# Patient Record
Sex: Female | Born: 2000 | Race: Black or African American | Hispanic: No | Marital: Single | Smoking: Never smoker
Health system: Southern US, Community
[De-identification: ages and names within clinical notes are randomized; demographics above are authoritative.]

## PROBLEM LIST (undated history)

## (undated) DIAGNOSIS — J45909 Unspecified asthma, uncomplicated: Secondary | ICD-10-CM

## (undated) DIAGNOSIS — F419 Anxiety disorder, unspecified: Secondary | ICD-10-CM

## (undated) DIAGNOSIS — F32A Depression, unspecified: Secondary | ICD-10-CM

## (undated) HISTORY — DX: Anxiety disorder, unspecified: F41.9

## (undated) HISTORY — DX: Depression, unspecified: F32.A

---
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
## 2022-02-25 ENCOUNTER — Other Ambulatory Visit: Payer: Self-pay

## 2022-02-25 ENCOUNTER — Emergency Department
Admission: EM | Admit: 2022-02-25 | Discharge: 2022-02-25 | Disposition: A | Discharge status: Home / Self Care | Payer: Medicaid Other | Attending: Student in an Organized Health Care Education/Training Program | Admitting: Student in an Organized Health Care Education/Training Program

## 2022-02-25 DIAGNOSIS — H1033 Unspecified acute conjunctivitis, bilateral: Secondary | ICD-10-CM | POA: Insufficient documentation

## 2022-02-25 MED ORDER — POLYMYXIN B-TRIMETHOPRIM 10000-0.1 UNIT/ML-% OP SOLN: 1.0000 [drp] | OPHTHALMIC | 0 refills | Status: AC

## 2022-02-25 NOTE — ED Provider Notes (Signed)
[facility] Provider Note    Event Date/Time   First MD Initiated Contact with Patient 02/25/22 1304     (approximate)   History   Conjunctivitis (X1 week)   HPI  Brianna Vang is a 21 y.o. female presents to the ER for evaluation of several days of bilateral eye itching as well as some purulence and matting every morning.  Has been trying Visine drops without much improvement.  Denies any blurry vision no photophobia.  No fevers or chills.     Physical Exam   Triage Vital Signs: ED Triage Vitals  Enc Vitals Group     BP 02/25/22 1138 98/75     Pulse Rate 02/25/22 1138 90     Resp 02/25/22 1138 16     Temp 02/25/22 1138 98.5 F (36.9 C)     Temp Source 02/25/22 1138 Oral     SpO2 02/25/22 1138 100 %     Weight 02/25/22 1135 160 lb (72.6 kg)     Height 02/25/22 1135 4\' 11"  (1.499 m)     Head Circumference --      Peak Flow --      Pain Score 02/25/22 1135 0     Pain Loc --      Pain Edu? --      Excl. in [place]? --     Most recent vital signs: Vitals:   02/25/22 1138  BP: 98/75  Pulse: 90  Resp: 16  Temp: 98.5 F (36.9 C)  SpO2: 100%     Constitutional: Alert  Eyes: Conjunctivae are red left greater than right..  Limbic sparing.  Pupils are equal round and reactive.  No proptosis.  Extraocular motions are intact. Head: Atraumatic. Nose: No congestion/rhinnorhea. Mouth/Throat: Mucous membranes are moist.   Neck: Painless ROM.  Cardiovascular:   Good peripheral circulation. Respiratory: Normal respiratory effort.  No retractions.  Gastrointestinal: Soft and nontender.  Musculoskeletal:  no deformity Neurologic:  MAE spontaneously. No gross focal neurologic deficits are appreciated.  Skin:  Skin is warm, dry and intact. No rash noted. Psychiatric: Mood and affect are normal. Speech and behavior are normal.    ED Results / Procedures / Treatments   Labs (all labs ordered are listed, but only abnormal results are  displayed) Labs Reviewed - No data to display   EKG     RADIOLOGY    PROCEDURES:  Critical Care performed:   Procedures   MEDICATIONS ORDERED IN ED: Medications - No data to display   IMPRESSION / MDM / [place] / ED COURSE  I reviewed the triage vital signs and the nursing notes.                              Differential diagnosis includes, but is not limited to, conjunctivitis, blepharitis, allergies, foreign body  Patient presents to the ER for evaluation symptoms as described above.  Exam and history consistent with conjunctivitis.  No injury to suggest ulcer.   Doesn't wear contacts. No blurry vision.  Will be treated with antibiotic ointment treat for conjunctivitis.  Will be given referral to ophthalmology as needed.  Discussed return precautions.  Patient agreeable to plan.   FINAL CLINICAL IMPRESSION(S) / ED DIAGNOSES   Final diagnoses:  Acute conjunctivitis of both eyes, unspecified acute conjunctivitis type     Rx / DC Orders   ED Discharge Orders  Ordered    trimethoprim-polymyxin b (POLYTRIM) ophthalmic solution  Every 4 hours        02/25/22 1308             Note:  This document was prepared using Dragon voice recognition software and may include unintentional dictation errors.    Willy Eddy, MD 02/25/22 1311

## 2022-02-25 NOTE — ED Triage Notes (Signed)
Pt reports possible left pink eye and seasonal allergies that started 1 week ago. Pt reports taking eye drops and tylenol. Pt reports getting seasonal allergies every year.

## 2023-04-07 ENCOUNTER — Emergency Department
Admission: EM | Admit: 2023-04-07 | Discharge: 2023-04-07 | Disposition: A | Discharge status: Home / Self Care | Payer: Self-pay | Attending: Emergency Medicine | Admitting: Emergency Medicine

## 2023-04-07 ENCOUNTER — Other Ambulatory Visit: Payer: Self-pay

## 2023-04-07 DIAGNOSIS — F12920 Cannabis use, unspecified with intoxication, uncomplicated: Secondary | ICD-10-CM | POA: Insufficient documentation

## 2023-04-07 MED ORDER — ONDANSETRON 4 MG PO TBDP
4.0000 mg | ORAL_TABLET | Freq: Once | ORAL | Status: AC
  Administered 2023-04-07: 4 mg via ORAL
  Filled 2023-04-07: qty 1

## 2023-04-07 NOTE — ED Triage Notes (Signed)
Pt ambulatory to triage. Reports ate a 20 mg THC edible at 2100. Pt states "I feel hazy." Denies pain. Alert and following commands. Breathing unlabored.

## 2023-04-07 NOTE — ED Notes (Signed)
Patient and family provided with discharge instructions including importance of not continuing drugs at home with stated understanding. Patient stable and ambulatory with steady even gait with safe ride from family.

## 2023-04-08 NOTE — ED Provider Notes (Signed)
   [facility] Provider Note    Event Date/Time   First MD Initiated Contact with Patient 04/07/23 2301     (approximate)   History   Ingestion   HPI  Brianna Vang is a 22 y.o. female who presents to the ED for evaluation of Ingestion   Patient presents acutely intoxicated with cannabis product.  She reports buying edibles online off of an Instagram page that were mailed to her.  She reports taking a single 20 mg THC edible this evening around 9 PM.  She reports feeling weird and wanted to make sure she was not going to overdose and die.  No suicidal thoughts.  No other ingestions.   Physical Exam   Triage Vital Signs: ED Triage Vitals  Encounter Vitals Group     BP 04/07/23 2252 114/72     Systolic BP Percentile --      Diastolic BP Percentile --      Pulse Rate 04/07/23 2252 (!) 101     Resp 04/07/23 2252 17     Temp 04/07/23 2252 97.9 F (36.6 C)     Temp Source 04/07/23 2252 Oral     SpO2 04/07/23 2252 100 %     Weight 04/07/23 2251 120 lb (54.4 kg)     Height 04/07/23 2251 4\' 11"  (1.499 m)     Head Circumference --      Peak Flow --      Pain Score 04/07/23 2251 0     Pain Loc --      Pain Education --      Exclude from Growth Chart --     Most recent vital signs: Vitals:   04/07/23 2252 04/07/23 2330  BP: 114/72 108/64  Pulse: (!) 101 88  Resp: 17 16  Temp: 97.9 F (36.6 C) 98 F (36.7 C)  SpO2: 100% 100%    General: Awake, no distress.  CV:  Good peripheral perfusion.  Resp:  Normal effort.  Abd:  No distention.  MSK:  No deformity noted.  Neuro:  No focal deficits appreciated. Other:     ED Results / Procedures / Treatments   Labs (all labs ordered are listed, but only abnormal results are displayed) Labs Reviewed - No data to display  EKG   RADIOLOGY   Official radiology report(s): No results found.  PROCEDURES and INTERVENTIONS:  Procedures  Medications  ondansetron (ZOFRAN-ODT) disintegrating  tablet 4 mg (4 mg Oral Given 04/07/23 2332)     IMPRESSION / MDM / ASSESSMENT AND PLAN / ED COURSE  I reviewed the triage vital signs and the nursing notes.  Differential diagnosis includes, but is not limited to, polysubstance abuse, intentional overdose, suicidal  Patient presents acutely intoxicated with cannabis edibles suitable for outpatient management.  No severe or psychotic features.  Provided Zofran, water and she has a safe ride home with sister and brother-in-law.     FINAL CLINICAL IMPRESSION(S) / ED DIAGNOSES   Final diagnoses:  Cannabis intoxication without complication (HCC)     Rx / DC Orders   ED Discharge Orders     None        Note:  This document was prepared using Dragon voice recognition software and may include unintentional dictation errors.   Delton [place], MD 04/08/23 [phone]

## 2024-01-07 ENCOUNTER — Ambulatory Visit (HOSPITAL_COMMUNITY)
Admission: EM | Admit: 2024-01-07 | Discharge: 2024-01-08 | Disposition: A | Discharge status: Other Acute Inpatient Hospital | Payer: Self-pay | Attending: Psychiatry | Admitting: Psychiatry

## 2024-01-07 DIAGNOSIS — Z79899 Other long term (current) drug therapy: Secondary | ICD-10-CM | POA: Insufficient documentation

## 2024-01-07 DIAGNOSIS — Z9151 Personal history of suicidal behavior: Secondary | ICD-10-CM | POA: Insufficient documentation

## 2024-01-07 DIAGNOSIS — F322 Major depressive disorder, single episode, severe without psychotic features: Secondary | ICD-10-CM | POA: Diagnosis present

## 2024-01-07 DIAGNOSIS — R45851 Suicidal ideations: Secondary | ICD-10-CM | POA: Insufficient documentation

## 2024-01-07 DIAGNOSIS — F411 Generalized anxiety disorder: Secondary | ICD-10-CM | POA: Insufficient documentation

## 2024-01-07 DIAGNOSIS — Z9152 Personal history of nonsuicidal self-harm: Secondary | ICD-10-CM | POA: Insufficient documentation

## 2024-01-07 LAB — CBC WITH DIFFERENTIAL/PLATELET
Abs Immature Granulocytes: 0.02 K/uL (ref 0.00–0.07)
Basophils Absolute: 0 K/uL (ref 0.0–0.1)
Basophils Relative: 0 %
Eosinophils Absolute: 0.4 K/uL (ref 0.0–0.5)
Eosinophils Relative: 6 %
HCT: 38.7 % (ref 36.0–46.0)
Hemoglobin: 11.6 g/dL — ABNORMAL LOW (ref 12.0–15.0)
Immature Granulocytes: 0 %
Lymphocytes Relative: 27 %
Lymphs Abs: 1.8 K/uL (ref 0.7–4.0)
MCH: 21.8 pg — ABNORMAL LOW (ref 26.0–34.0)
MCHC: 30 g/dL (ref 30.0–36.0)
MCV: 72.9 fL — ABNORMAL LOW (ref 80.0–100.0)
Monocytes Absolute: 0.6 K/uL (ref 0.1–1.0)
Monocytes Relative: 9 %
Neutro Abs: 3.8 K/uL (ref 1.7–7.7)
Neutrophils Relative %: 58 %
Platelets: 330 K/uL (ref 150–400)
RBC: 5.31 MIL/uL — ABNORMAL HIGH (ref 3.87–5.11)
RDW: 16 % — ABNORMAL HIGH (ref 11.5–15.5)
WBC: 6.7 K/uL (ref 4.0–10.5)
nRBC: 0 % (ref 0.0–0.2)

## 2024-01-07 LAB — URINALYSIS, ROUTINE W REFLEX MICROSCOPIC
Bacteria, UA: NONE SEEN
Bilirubin Urine: NEGATIVE
Glucose, UA: NEGATIVE mg/dL
Hgb urine dipstick: NEGATIVE
Ketones, ur: 20 mg/dL — AB
Leukocytes,Ua: NEGATIVE
Nitrite: NEGATIVE
Protein, ur: 30 mg/dL — AB
Specific Gravity, Urine: 1.027 (ref 1.005–1.030)
pH: 5 (ref 5.0–8.0)

## 2024-01-07 LAB — TSH: TSH: 0.351 u[IU]/mL (ref 0.350–4.500)

## 2024-01-07 LAB — COMPREHENSIVE METABOLIC PANEL WITH GFR
ALT: 10 U/L (ref 0–44)
AST: 17 U/L (ref 15–41)
Albumin: 4.1 g/dL (ref 3.5–5.0)
Alkaline Phosphatase: 56 U/L (ref 38–126)
Anion gap: 13 (ref 5–15)
BUN: 9 mg/dL (ref 6–20)
CO2: 23 mmol/L (ref 22–32)
Calcium: 9.5 mg/dL (ref 8.9–10.3)
Chloride: 102 mmol/L (ref 98–111)
Creatinine, Ser: 0.64 mg/dL (ref 0.44–1.00)
GFR, Estimated: >60 mL/min (ref >60)
Glucose, Bld: 77 mg/dL (ref 70–99)
Potassium: 4 mmol/L (ref 3.5–5.1)
Sodium: 138 mmol/L (ref 135–145)
Total Bilirubin: 2.9 mg/dL — ABNORMAL HIGH (ref 0.0–1.2)
Total Protein: 7.1 g/dL (ref 6.5–8.1)

## 2024-01-07 LAB — MAGNESIUM: Magnesium: 2.1 mg/dL (ref 1.7–2.4)

## 2024-01-07 LAB — POCT URINE DRUG SCREEN - MANUAL ENTRY (I-SCREEN)
POC Amphetamine UR: NOT DETECTED
POC Buprenorphine (BUP): NOT DETECTED
POC Cocaine UR: NOT DETECTED
POC Marijuana UR: NOT DETECTED
POC Methadone UR: NOT DETECTED
POC Methamphetamine UR: NOT DETECTED
POC Morphine: NOT DETECTED
POC Oxazepam (BZO): NOT DETECTED
POC Oxycodone UR: NOT DETECTED
POC Secobarbital (BAR): NOT DETECTED

## 2024-01-07 LAB — LIPID PANEL
Cholesterol: 168 mg/dL (ref 0–200)
HDL: 58 mg/dL (ref >40)
LDL Cholesterol: 102 mg/dL — ABNORMAL HIGH (ref 0–99)
Total CHOL/HDL Ratio: 2.9 ratio
Triglycerides: 41 mg/dL (ref <150)
VLDL: 8 mg/dL (ref 0–40)

## 2024-01-07 LAB — VITAMIN B12: Vitamin B-12: 735 pg/mL (ref 180–914)

## 2024-01-07 LAB — HEMOGLOBIN A1C
Hgb A1c MFr Bld: 5 % (ref 4.8–5.6)
Mean Plasma Glucose: 96.8 mg/dL

## 2024-01-07 LAB — POC URINE PREG, ED: Preg Test, Ur: NEGATIVE

## 2024-01-07 LAB — ETHANOL: Alcohol, Ethyl (B): <15 mg/dL (ref <15)

## 2024-01-07 LAB — VITAMIN D 25 HYDROXY (VIT D DEFICIENCY, FRACTURES): Vit D, 25-Hydroxy: 5.6 ng/mL — ABNORMAL LOW (ref 30–100)

## 2024-01-07 MED ORDER — HALOPERIDOL LACTATE 5 MG/ML IJ SOLN: 10.0000 mg | Freq: Three times a day (TID) | INTRAMUSCULAR | Status: DC | PRN

## 2024-01-07 MED ORDER — ACETAMINOPHEN 325 MG PO TABS: 650.0000 mg | ORAL_TABLET | Freq: Four times a day (QID) | ORAL | Status: DC | PRN

## 2024-01-07 MED ORDER — DIPHENHYDRAMINE HCL 50 MG/ML IJ SOLN: 50.0000 mg | Freq: Three times a day (TID) | INTRAMUSCULAR | Status: DC | PRN

## 2024-01-07 MED ORDER — DIPHENHYDRAMINE HCL 50 MG PO CAPS: 50.0000 mg | ORAL_CAPSULE | Freq: Three times a day (TID) | ORAL | Status: DC | PRN

## 2024-01-07 MED ORDER — MAGNESIUM HYDROXIDE 400 MG/5ML PO SUSP: 30.0000 mL | Freq: Every day | ORAL | Status: DC | PRN

## 2024-01-07 MED ORDER — HALOPERIDOL LACTATE 5 MG/ML IJ SOLN: 5.0000 mg | Freq: Three times a day (TID) | INTRAMUSCULAR | Status: DC | PRN

## 2024-01-07 MED ORDER — LORAZEPAM 2 MG/ML IJ SOLN: 2.0000 mg | Freq: Three times a day (TID) | INTRAMUSCULAR | Status: DC | PRN

## 2024-01-07 MED ORDER — HALOPERIDOL 5 MG PO TABS: 5.0000 mg | ORAL_TABLET | Freq: Three times a day (TID) | ORAL | Status: DC | PRN

## 2024-01-07 MED ORDER — HYDROXYZINE HCL 25 MG PO TABS: 25.0000 mg | ORAL_TABLET | Freq: Three times a day (TID) | ORAL | Status: DC | PRN

## 2024-01-07 MED ORDER — ALUM & MAG HYDROXIDE-SIMETH 200-200-20 MG/5ML PO SUSP: 30.0000 mL | ORAL | Status: DC | PRN

## 2024-01-07 MED ORDER — TRAZODONE HCL 50 MG PO TABS: 50.0000 mg | ORAL_TABLET | Freq: Every evening | ORAL | Status: DC | PRN

## 2024-01-07 NOTE — ED Notes (Signed)
 Patient currently sleeping and resting in recliner. RR even and unlabored, appearing in no noted distress. Environmental check complete

## 2024-01-07 NOTE — ED Notes (Addendum)
 Patient calm and cooperative during initial nurse shift assessment. Pt observed/assessed resting in recliner. Patient denies any SI/HI/AVH. Superficial cut on right wrist dry, clean, and no drainage. RR even and unlabored, appearing in no noted distress. Environmental check complete.
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
## 2024-01-07 NOTE — ED Notes (Signed)
 Pt oriented to unit, questions denied. Pt reports passive si with no plan or intent, verbal contract for safety provided. Pt provided with dinner. Pt generally cooperative and polite.
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
## 2024-01-07 NOTE — ED Notes (Signed)
 Lab notified nurse that Urine drug of abuse needed to be ordered for the lab to process. Notified Ene, NP of labs order to be able to result. Per Ene, NP cancel lab due to Urine drug screen collected and resulted at [facility]. Notified Lab, Licia Romp, that lab for Urine drug of abuse can be cancelled.

## 2024-01-07 NOTE — ED Notes (Signed)
 Patient currently resting in recliner. RR even and unlabored, appearing in no noted distress. Environmental check complete

## 2024-01-07 NOTE — ED Notes (Signed)
 Brianna Vang has been called for pick. Order number [id number]

## 2024-01-07 NOTE — ED Notes (Addendum)
 Dash recalled for labs.
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
## 2024-01-08 ENCOUNTER — Other Ambulatory Visit: Payer: Self-pay

## 2024-01-08 ENCOUNTER — Inpatient Hospital Stay (HOSPITAL_COMMUNITY)
Admission: AD | Admit: 2024-01-08 | Discharge: 2024-01-11 | DRG: 885 | Disposition: A | Discharge status: Home / Self Care | Payer: Self-pay | Source: Intra-hospital

## 2024-01-08 ENCOUNTER — Encounter (HOSPITAL_COMMUNITY): Payer: Self-pay | Admitting: Nurse Practitioner

## 2024-01-08 DIAGNOSIS — F319 Bipolar disorder, unspecified: Principal | ICD-10-CM | POA: Diagnosis present

## 2024-01-08 DIAGNOSIS — J45909 Unspecified asthma, uncomplicated: Secondary | ICD-10-CM | POA: Diagnosis present

## 2024-01-08 DIAGNOSIS — Z79899 Other long term (current) drug therapy: Secondary | ICD-10-CM | POA: Diagnosis not present

## 2024-01-08 DIAGNOSIS — F410 Panic disorder [episodic paroxysmal anxiety] without agoraphobia: Secondary | ICD-10-CM | POA: Diagnosis present

## 2024-01-08 DIAGNOSIS — Z9151 Personal history of suicidal behavior: Secondary | ICD-10-CM | POA: Diagnosis not present

## 2024-01-08 DIAGNOSIS — R45851 Suicidal ideations: Secondary | ICD-10-CM | POA: Diagnosis present

## 2024-01-08 DIAGNOSIS — E559 Vitamin D deficiency, unspecified: Secondary | ICD-10-CM | POA: Diagnosis present

## 2024-01-08 DIAGNOSIS — F5104 Psychophysiologic insomnia: Secondary | ICD-10-CM | POA: Diagnosis present

## 2024-01-08 DIAGNOSIS — F603 Borderline personality disorder: Secondary | ICD-10-CM | POA: Diagnosis present

## 2024-01-08 DIAGNOSIS — F329 Major depressive disorder, single episode, unspecified: Secondary | ICD-10-CM | POA: Diagnosis present

## 2024-01-08 DIAGNOSIS — Z833 Family history of diabetes mellitus: Secondary | ICD-10-CM | POA: Diagnosis not present

## 2024-01-08 DIAGNOSIS — F411 Generalized anxiety disorder: Secondary | ICD-10-CM | POA: Diagnosis present

## 2024-01-08 DIAGNOSIS — F4320 Adjustment disorder, unspecified: Secondary | ICD-10-CM | POA: Diagnosis present

## 2024-01-08 HISTORY — DX: Unspecified asthma, uncomplicated: J45.909

## 2024-01-08 HISTORY — DX: Bipolar disorder, unspecified: F31.9

## 2024-01-08 HISTORY — DX: Personal history of suicidal behavior: Z91.51

## 2024-01-08 LAB — FERRITIN: Ferritin: 8 ng/mL — ABNORMAL LOW (ref 11–307)

## 2024-01-08 LAB — IRON AND TIBC
Iron: 54 ug/dL (ref 28–170)
Saturation Ratios: 13 % (ref 10.4–31.8)
TIBC: 424 ug/dL (ref 250–450)
UIBC: 371 ug/dL

## 2024-01-08 LAB — RETICULOCYTES
Immature Retic Fract: 11.9 % (ref 2.3–15.9)
RBC.: 5.08 MIL/uL (ref 3.87–5.11)
Retic Count, Absolute: 63 K/uL (ref 19.0–186.0)
Retic Ct Pct: 1.2 % (ref 0.4–3.1)

## 2024-01-08 LAB — FOLATE: Folate: 11 ng/mL (ref >5.9)

## 2024-01-08 LAB — VITAMIN B12: Vitamin B-12: 912 pg/mL (ref 180–914)

## 2024-01-08 MED ORDER — QUETIAPINE FUMARATE 25 MG PO TABS
25.0000 mg | ORAL_TABLET | Freq: Every day | ORAL | Status: DC
  Administered 2024-01-08: 25 mg via ORAL
  Filled 2024-01-08: qty 1

## 2024-01-08 MED ORDER — ALUM & MAG HYDROXIDE-SIMETH 200-200-20 MG/5ML PO SUSP: 30.0000 mL | ORAL | Status: DC | PRN

## 2024-01-08 MED ORDER — MAGNESIUM HYDROXIDE 400 MG/5ML PO SUSP: 30.0000 mL | Freq: Every day | ORAL | Status: DC | PRN

## 2024-01-08 MED ORDER — ACETAMINOPHEN 325 MG PO TABS: 650.0000 mg | ORAL_TABLET | Freq: Four times a day (QID) | ORAL | Status: DC | PRN

## 2024-01-08 MED ORDER — HYDROXYZINE HCL 25 MG PO TABS
25.0000 mg | ORAL_TABLET | Freq: Three times a day (TID) | ORAL | Status: DC | PRN
  Administered 2024-01-08: 25 mg via ORAL
  Filled 2024-01-08 (×2): qty 1

## 2024-01-08 MED ORDER — OLANZAPINE 5 MG PO TBDP: 5.0000 mg | ORAL_TABLET | Freq: Three times a day (TID) | ORAL | Status: DC | PRN

## 2024-01-08 MED ORDER — TRAZODONE HCL 50 MG PO TABS: 50.0000 mg | ORAL_TABLET | Freq: Every evening | ORAL | Status: DC | PRN

## 2024-01-08 MED ORDER — OLANZAPINE 10 MG IM SOLR: 10.0000 mg | Freq: Three times a day (TID) | INTRAMUSCULAR | Status: DC | PRN

## 2024-01-08 MED ORDER — OLANZAPINE 10 MG IM SOLR: 5.0000 mg | Freq: Three times a day (TID) | INTRAMUSCULAR | Status: DC | PRN

## 2024-01-08 MED ORDER — VITAMIN D (ERGOCALCIFEROL) 1.25 MG (50000 UNIT) PO CAPS
50000.0000 [IU] | ORAL_CAPSULE | Freq: Two times a day (BID) | ORAL | Status: DC
  Administered 2024-01-08 – 2024-01-11 (×6): 50000 [IU] via ORAL
  Filled 2024-01-08 (×6): qty 1

## 2024-01-08 MED ORDER — ESCITALOPRAM OXALATE 10 MG PO TABS
10.0000 mg | ORAL_TABLET | Freq: Every day | ORAL | Status: DC
  Administered 2024-01-08 – 2024-01-11 (×4): 10 mg via ORAL
  Filled 2024-01-08: qty 1
  Filled 2024-01-08: qty 7
  Filled 2024-01-08 (×3): qty 1
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
## 2024-01-08 NOTE — [facility] Group Notes (Signed)
 [facility] Group Notes:  (Nursing/MHT/Case Management/Adjunct)  Date:  01/08/2024  Time:  9:21 AM  Type of Therapy:  Group Topic/ Focus: Goals Group: The focus of this group is to help patients establish daily goals to achieve during treatment and discuss how the patient can incorporate goal setting into their daily lives to aide in recovery.   Participation Level:  Did Not Attend  Summary of Progress/Problems:  Patient did not attend goals group today. Patient was encouraged but refused.   Danette JONELLE Boos 01/08/2024, 9:21 AM
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
## 2024-01-08 NOTE — BHH Group Notes (Signed)
 BHH Group Notes:  (Nursing/MHT/Case Management/Adjunct)  Date:  01/08/2024  Time:  2:40 PM  Type of Therapy: Bingo  Participation Level:  Did Not Attend  Summary of Progress/Problems:  Patient did not attend or participate in Bingo. Patient was encouraged but refused.   Danette JONELLE Boos 01/08/2024, 2:40 PM
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
## 2024-01-08 NOTE — Progress Notes (Addendum)
 D. Pt presented anxious, but friendly - reported 'fair' sleep last night, described her appetite as 'fair', energy level as 'low', and concentration as 'good'.Per pt's self inventory, pt rated her depression, hopelessness and anxiety a 6/5/5, respectively. Pt's stated goal today is to take my mind off negative thoughts or things in general from break up and how I think of myself.Pt currently denies SI/HI and AVH and agrees to contact staff before acting on any harmful thoughts. A. Labs and vitals monitored. Pt given and educated on medications. Pt supported emotionally and encouraged to express concerns and ask questions.   R. Pt remains safe with 15 minute checks. Will continue POC.    01/08/24 1300  Psych Admission Type (Psych Patients Only)  Admission Status Voluntary  Psychosocial Assessment  Patient Complaints None  Eye Contact Fair  Facial Expression Animated  Affect Appropriate to circumstance  Speech Logical/coherent  Interaction Minimal  Motor Activity Other (Comment) (steady gait)  Appearance/Hygiene Unremarkable  Behavior Characteristics Cooperative;Appropriate to situation  Mood Depressed;Pleasant  Thought Process  Coherency WDL  Content WDL  Delusions None reported or observed  Perception WDL  Hallucination None reported or observed  Judgment Poor  Confusion None  Danger to Self  Current suicidal ideation? Denies  Agreement Not to Harm Self Yes  Description of Agreement agreed to contact staff before acting on harmful thoughts  Danger to Others  Danger to Others None reported or observed
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
## 2024-01-08 NOTE — Group Note (Signed)
 Date:  01/08/2024 Time:  4:05 PM  Group Topic/Focus:  The focus of this group is to introduce collage as a creative outlet for expressing emotions, reducing anxiety, fostering group cohesion and enabling personal insight.    Participation Level:  Active  Participation Quality:  Appropriate and Attentive  Affect:  Appropriate  Cognitive:  Alert and Appropriate  Insight: Appropriate and Good  Engagement in Group:  Engaged  Modes of Intervention:  Activity, Discussion, Exploration, Rapport Building, Socialization, and Support  Additional Comments:    Berwyn GORMAN Acosta 01/08/2024, 4:05 PM

## 2024-01-08 NOTE — Plan of Care (Signed)
   Problem: Education: Goal: Emotional status will improve Outcome: Progressing Goal: Mental status will improve Outcome: Progressing

## 2024-01-08 NOTE — Group Note (Signed)
 Date:  01/08/2024 Time:  8:55 PM  Group Topic/Focus:  Wrap-Up Group:   The focus of this group is to help patients review their daily goal of treatment and discuss progress on daily workbooks.    Participation Level:  Active  Participation Quality:  Appropriate  Affect:  Appropriate  Cognitive:  Appropriate  Insight: Appropriate  Engagement in Group:  Engaged  Modes of Intervention:  Discussion  Additional Comments:   Pt states that she's proud of herself for being able to socialize today and participate in programming. Today is pts first full day on the unit. Pt is getting acclimated to the unit. Pt spoke with doctor and csw stating they are very informative. Pt rates anxiety and depression at about Brianna 5.   Brianna Vang Brianna Vang 01/08/2024, 8:55 PM

## 2024-01-08 NOTE — Plan of Care (Signed)
   Problem: Education: Goal: Knowledge of [place] [facility] Education information/materials will improve Outcome: Progressing

## 2024-01-08 NOTE — Progress Notes (Signed)
(  Sleep Hours) - 3.5 hours (Any PRNs that were needed, meds refused, or side effects to meds)-  None (Any disturbances and when (visitation, over night)- None (Concerns raised by the patient)-  None (SI/HI/AVH)-  Denies

## 2024-01-09 ENCOUNTER — Encounter (HOSPITAL_COMMUNITY): Payer: Self-pay

## 2024-01-09 NOTE — Group Note (Signed)
 Date:  01/09/2024 Time:  3:59 PM  Group Topic/Focus: Transforming your thinking Managing Feelings:   The focus of this group is to identify what feelings patients have difficulty handling and develop a plan to handle them in a healthier way upon discharge.    Participation Level:  Active  Participation Quality:  Appropriate  Affect:  Appropriate  Cognitive:  Alert  Insight: Appropriate  Engagement in Group:  Engaged  Modes of Intervention:  Discussion  Additional Comments:  Brianna Vang was very engaged during group and asked appropriate questions.   Sharde Gover 01/09/2024, 3:59 PM

## 2024-01-09 NOTE — Progress Notes (Signed)
 [facility] Inpatient Psychiatry Progress Note  Date: 01/09/24 Patient: Brianna Vang MRN: [id number]  Assessment and Plan: Brianna Vang is a 23 y.o. female admitted for suicidal ideation in the context of of a recent breakup. She endorses a history of multiple low-lethality gestures/attempts and other symptoms consistent with bluster B traits. Of note, the reported suicide attempt/gesture was 8/27 (five days ago).      # MDD (major depressive disorder) # Cluster B traits - Escitalopram  10 mg daily - Stop quetiapine   Risk Assessment - Moderate  Discharge Planning Barriers to discharge: Incomplete initial assessment; need to coordinate aftercare plan. Estimated length of stay: 1-2 days Predicted Discharge location: Home    Interval History: Chart reviewed. No significant events overnight. Patient has been med compliant. She reports feeling better today and rates her anxiety as a 5/10. She denied any SI or side effects from escitalopram . She reported sleeping reasonably well last night but would like to stop quetiapine  due to feeling groggy in the morning. She reports a history of multiple prior suicide attempts and gestures and states that the last time she exhibited any suicidal behaviors was Wednesday of last week. She specifically inquired about whether or not she could leave tomorrow and she plans on returning home to stay with her sister.       Physical Exam MSK/Neuro - Normal gait and station Mental Status Exam Appearance - Casually dressed, NAD Attitude - Calm, pleasant Speech - normal volume, prosody, inflection Mood - Fair Affect - Mildly restricted Thought Process - LLGD Thought Content - No delusional TC expressed SI/HI - Denies Perceptions - Denies AVH; not RIS Judgement/Insight - Fair Fund of knowledge - WNL Language - No impairments      Lab Results:  Admission on 01/08/2024  Component Date Value Ref Range Status   Vitamin  B-12 01/08/2024 912  180 - 914 pg/mL Final   Folate 01/08/2024 11.0  >5.9 ng/mL Final   Iron 01/08/2024 54  28 - 170 ug/dL Final   TIBC 91/68/7974 424  250 - 450 ug/dL Final   Saturation Ratios 01/08/2024 13  10.4 - 31.8 % Final   UIBC 01/08/2024 371  ug/dL Final   Ferritin 91/68/7974 8 (L)  11 - 307 ng/mL Final   Retic Ct Pct 01/08/2024 1.2  0.4 - 3.1 % Final   RBC. 01/08/2024 5.08  3.87 - 5.11 MIL/uL Final   Retic Count, Absolute 01/08/2024 63.0  19.0 - 186.0 K/uL Final   Immature Retic Fract 01/08/2024 11.9  2.3 - 15.9 % Final     Vitals: Blood pressure 104/73, pulse 94, temperature 98.1 F (36.7 C), temperature source Oral, resp. rate 20, height 4' 11 (1.499 m), weight 48.3 kg, last menstrual period 01/05/2024, SpO2 100%.    Brianna DELENA Salmon, DO

## 2024-01-09 NOTE — Group Note (Signed)
 Recreation Therapy Group Note   Group Topic:Leisure Education  Group Date: 01/09/2024 Start Time: 0935 End Time: 1015 Facilitators: Joshlyn Beadle-McCall, LRT,CTRS Location: [address]   Group Topic: Leisure Education   Goal Area(s) Addresses:  Patient will successfully identify positive leisure and recreation activities.  Patient will acknowledge benefits of participation in healthy leisure activities post discharge.  Patient will actively work with peers toward a shared goal.   Behavioral Response: Engaged   Intervention: Competitive Group Game   Activity: Guess the [employer]. Patients were to use the spinner to land on a category ([address], Hip Hop, Indie, Dance, R&B and Pop). Whatever category the patient landed on, LRT would read the lyric and patient had to guess the missing word from the lyric. It patient didn't guess the correct answer, everyone else got a chance to steal the point. Whoever guessed the correct answer, got the point. The person with the most cards at the end of the game was the winner.   Education:  Teacher, English as a foreign language, Stress Management, Discharge Planning  Education Outcome: Acknowledges education/In group clarification offered/Needs additional education   Affect/Mood: Appropriate   Participation Level: Engaged   Participation Quality: Independent   Behavior: Appropriate   Speech/Thought Process: Focused   Insight: Good   Judgement: Good   Modes of Intervention: Competitive Play   Patient Response to Interventions:  Engaged   Education Outcome:  In group clarification offered    Clinical Observations/Individualized Feedback: Pt was engaged and focused during activity. Pt was bright and competitive. Pt also had good interaction with peers during activity.       Plan: Continue to engage patient in RT group sessions 2-3x/week.   Deren Degrazia-McCall, LRT,CTRS 01/09/2024 11:53 AM

## 2024-01-09 NOTE — Progress Notes (Addendum)
 D. Pt continues to present anxious, but friendly upon approach, has been visible in the milieu, observed attending groups. Pt reported poor sleep last night, stating that she woke up and was anxious, and didn't like how the sleep medication made her feel. Pt described her appetite as 'fair', concentration as 'good', and energy level as 'normal'. Per pt's self inventory, pt rated her depression, hopelessness and anxiety a 5/3/7, respectively. Pt currently denies SI/HI and AVH and doesn't appear to be responding to internal stimuli. A. Labs and vitals monitored. Pt given and educated on medications. Pt supported emotionally and encouraged to express concerns and ask questions.   R. Pt remains safe with 15 minute checks. Will continue POC.    01/09/24 1100  Psych Admission Type (Psych Patients Only)  Admission Status Voluntary  Psychosocial Assessment  Patient Complaints None  Eye Contact Fair  Facial Expression Anxious  Affect Appropriate to circumstance  Speech Logical/coherent  Interaction Assertive  Motor Activity Other (Comment) (level 3 observation)  Appearance/Hygiene In scrubs  Behavior Characteristics Cooperative;Appropriate to situation  Mood Pleasant;Anxious  Thought Process  Coherency WDL  Content WDL  Delusions None reported or observed  Perception WDL  Hallucination None reported or observed  Judgment Poor  Confusion None  Danger to Self  Current suicidal ideation? Denies  Agreement Not to Harm Self Yes  Description of Agreement agreed to contact staff before acting on harmful thoughts  Danger to Others  Danger to Others None reported or observed

## 2024-01-09 NOTE — BH Assessment (Signed)
(  Sleep Hours) - 8 (Any PRNs that were needed, meds refused, or side effects to meds)- Hydroxyzine  25 mg PO (Any disturbances and when (visitation, over night)- None (Concerns raised by the patient)- Pt is interested in out patient services and request to be D/C in a few days.  (SI/HI/AVH)- None

## 2024-01-09 NOTE — Progress Notes (Signed)
   01/08/24 2030  Psych Admission Type (Psych Patients Only)  Admission Status Voluntary  Psychosocial Assessment  Patient Complaints Sadness  Eye Contact Fair  Facial Expression Animated  Affect Appropriate to circumstance  Speech Logical/coherent  Interaction Assertive  Motor Activity Other (Comment) (WNL)  Appearance/Hygiene In scrubs  Behavior Characteristics Appropriate to situation  Mood Pleasant  Thought Process  Coherency WDL  Content WDL  Delusions None reported or observed  Perception WDL  Hallucination None reported or observed  Judgment Impaired  Confusion None  Danger to Self  Current suicidal ideation?  (Denies)  Agreement Not to Harm Self Yes  Description of Agreement Notify Staff  Danger to Others  Danger to Others None reported or observed

## 2024-01-09 NOTE — [facility]HH Group Notes (Signed)
 Adult Psychoeducational Group Note  Date:  01/09/2024 Time:  10:50 PM  Group Topic/Focus:  Wrap-Up Group:   The focus of this group is to help patients review their daily goal of treatment and discuss progress on daily workbooks.  Participation Level:  Active  Participation Quality:  Appropriate  Affect:  Appropriate  Cognitive:  Alert  Insight: Appropriate  Engagement in Group:  Engaged  Modes of Intervention:  Discussion  Additional Comments:  Patient attended and participated in the Wrap-up group.  Brianna Vang 01/09/2024, 10:50 PM

## 2024-01-09 NOTE — Group Note (Signed)
 Date:  01/09/2024 Time:  8:55 AM  Group Topic/Focus:  Goals Group:   The focus of this group is to help patients establish daily goals to achieve during treatment and discuss how the patient can incorporate goal setting into their daily lives to aide in recovery. Orientation:   The focus of this group is to educate the patient on the purpose and policies of crisis stabilization and provide a format to answer questions about their admission.  The group details unit policies and expectations of patients while admitted.    Participation Level:  Active  Participation Quality:  Appropriate  Affect:  Appropriate  Cognitive:  Appropriate  Insight: Good  Engagement in Group:  Engaged  Modes of Intervention:  Orientation  Additional Comments:    Brianna Vang 01/09/2024, 8:55 AM

## 2024-01-09 NOTE — Plan of Care (Signed)
   Problem: Education: Goal: Knowledge of [place] [facility] Education information/materials will improve Outcome: Progressing Goal: Emotional status will improve Outcome: Progressing Goal: Mental status will improve Outcome: Progressing Goal: Verbalization of understanding the information provided will improve Outcome: Progressing

## 2024-01-09 NOTE — BH IP Treatment Plan (Signed)
 Interdisciplinary Treatment and Diagnostic Plan Update  01/09/2024 Time of Session: 10:30AM CONCETTA GUION MRN: [id number]  Principal Diagnosis: MDD (major depressive disorder)  Secondary Diagnoses: Principal Problem:   MDD (major depressive disorder) Active Problems:   Borderline personality disorder (HCC)   Chronic insomnia   Vitamin D  deficiency   Generalized anxiety disorder with panic attacks   Current Medications:  Current Facility-Administered Medications  Medication Dose Route Frequency Provider Last Rate Last Admin   acetaminophen  (TYLENOL ) tablet 650 mg  650 mg Oral Q6H PRN Ajibola, Ene A, NP       alum & mag hydroxide-simeth (MAALOX/MYLANTA) 200-200-20 MG/5ML suspension 30 mL  30 mL Oral Q4H PRN Ajibola, Ene A, NP       escitalopram  (LEXAPRO ) tablet 10 mg  10 mg Oral Daily Parker, Alvin S, MD   10 mg at 01/09/24 9147   hydrOXYzine  (ATARAX ) tablet 25 mg  25 mg Oral TID PRN Ajibola, Ene A, NP   25 mg at 01/08/24 2101   magnesium  hydroxide (MILK OF MAGNESIA) suspension 30 mL  30 mL Oral Daily PRN Ajibola, Ene A, NP       OLANZapine  (ZYPREXA ) injection 10 mg  10 mg Intramuscular TID PRN Parker, Alvin S, MD       OLANZapine  (ZYPREXA ) injection 5 mg  5 mg Intramuscular TID PRN Parker, Alvin S, MD       OLANZapine  zydis (ZYPREXA ) disintegrating tablet 5 mg  5 mg Oral TID PRN Kennyth Starleen RAMAN, MD       Vitamin D  (Ergocalciferol ) (DRISDOL ) 1.25 MG (50000 UNIT) capsule 50,000 Units  50,000 Units Oral BID Parker, Alvin S, MD   50,000 Units at 01/09/24 9147   PTA Medications: No medications prior to admission.    Patient Stressors: Marital or family conflict   Traumatic event    Patient Strengths: Average or above average intelligence  Capable of independent living  Communication skills  Physical Health  Supportive family/friends  Work skills   Treatment Modalities: Medication Management, Group therapy, Case management,  1 to 1 session with clinician, Psychoeducation,  Recreational therapy.   Physician Treatment Plan for Primary Diagnosis: MDD (major depressive disorder) Long Term Goal(s):     Short Term Goals:    Medication Management: Evaluate patient's response, side effects, and tolerance of medication regimen.  Therapeutic Interventions: 1 to 1 sessions, Unit Group sessions and Medication administration.  Evaluation of Outcomes: Not Progressing  Physician Treatment Plan for Secondary Diagnosis: Principal Problem:   MDD (major depressive disorder) Active Problems:   Borderline personality disorder (HCC)   Chronic insomnia   Vitamin D  deficiency   Generalized anxiety disorder with panic attacks  Long Term Goal(s):     Short Term Goals:       Medication Management: Evaluate patient's response, side effects, and tolerance of medication regimen.  Therapeutic Interventions: 1 to 1 sessions, Unit Group sessions and Medication administration.  Evaluation of Outcomes: Not Progressing   RN Treatment Plan for Primary Diagnosis: MDD (major depressive disorder) Long Term Goal(s): Knowledge of disease and therapeutic regimen to maintain health will improve  Short Term Goals: Ability to remain free from injury will improve, Ability to verbalize frustration and anger appropriately will improve, Ability to demonstrate self-control, Ability to participate in decision making will improve, Ability to verbalize feelings will improve, Ability to disclose and discuss suicidal ideas, and Ability to identify and develop effective coping behaviors will improve  Medication Management: RN will administer medications as ordered by provider, will assess  and evaluate patient's response and provide education to patient for prescribed medication. RN will report any adverse and/or side effects to prescribing provider.  Therapeutic Interventions: 1 on 1 counseling sessions, Psychoeducation, Medication administration, Evaluate responses to treatment, Monitor vital signs  and CBGs as ordered, Perform/monitor CIWA, COWS, AIMS and Fall Risk screenings as ordered, Perform wound care treatments as ordered.  Evaluation of Outcomes: Not Progressing   LCSW Treatment Plan for Primary Diagnosis: MDD (major depressive disorder) Long Term Goal(s): Safe transition to appropriate next level of care at discharge, Engage patient in therapeutic group addressing interpersonal concerns.  Short Term Goals: Engage patient in aftercare planning with referrals and resources, Increase social support, Increase ability to appropriately verbalize feelings, Increase emotional regulation, Facilitate acceptance of mental health diagnosis and concerns, Facilitate patient progression through stages of change regarding substance use diagnoses and concerns, and Identify triggers associated with mental health/substance abuse issues  Therapeutic Interventions: Assess for all discharge needs, 1 to 1 time with Social worker, Explore available resources and support systems, Assess for adequacy in community support network, Educate family and significant other(s) on suicide prevention, Complete Psychosocial Assessment, Interpersonal group therapy.  Evaluation of Outcomes: Not Progressing   Progress in Treatment: Attending groups: Yes. Participating in groups: Yes. Taking medication as prescribed: Yes. Toleration medication: Yes. Family/Significant other contact made: No, will contact:  Cassandra Morrow(mother) [phone]  Patient understands diagnosis: Yes. Discussing patient identified problems/goals with staff: Yes. Medical problems stabilized or resolved: Yes. Denies suicidal/homicidal ideation: Yes Issues/concerns per patient self-inventory: No.  Patient Goals:  maintaining my anxiety and making sure I'm keeping myself busy.  Discharge Plan or Barriers: Patient likely to discharge home with family once stable.   Reason for Continuation of Hospitalization: Anxiety Medication  stabilization  Estimated Length of Stay: 3-5 days  Last 3 [place] Suicide Severity Risk Score: Flowsheet Row Ad[facility] (Current) [facility] from 01[facility]07/2024 in [facility] ED from [facility]07/20[facility]  C-SSRS RISK CATEGORY High Risk High Risk No Risk    Last PHQ 2/9 Scores:     No data to display          Scribe for Treatment Team: Akito Boomhower M Suhani Stillion, [place]RA[place] 01/09/2024 1:45 PM

## 2024-01-09 NOTE — Plan of Care (Signed)
   Problem: Education: Goal: Knowledge of [place] [facility] Education information/materials will improve Outcome: Progressing Goal: Verbalization of understanding the information provided will improve Outcome: Progressing

## 2024-01-10 DIAGNOSIS — F329 Major depressive disorder, single episode, unspecified: Secondary | ICD-10-CM

## 2024-01-10 DIAGNOSIS — E559 Vitamin D deficiency, unspecified: Secondary | ICD-10-CM

## 2024-01-10 DIAGNOSIS — F603 Borderline personality disorder: Secondary | ICD-10-CM

## 2024-01-10 DIAGNOSIS — F5104 Psychophysiologic insomnia: Secondary | ICD-10-CM

## 2024-01-10 DIAGNOSIS — F410 Panic disorder [episodic paroxysmal anxiety] without agoraphobia: Secondary | ICD-10-CM

## 2024-01-10 DIAGNOSIS — F411 Generalized anxiety disorder: Secondary | ICD-10-CM

## 2024-01-10 NOTE — Progress Notes (Signed)
 D: Patient is alert, oriented, pleasant, and cooperative. Denies SI, HI, AVH, and verbally contracts for safety. Patient reports she slept fair last night without sleeping medication. Patient reports her appetite as good, energy level as normal, and concentration as good. Patient rates her depression 3/10, hopelessness 3/10, and anxiety 3/10. Patient denies physical symptoms/pain.    A: Scheduled medications administered per MD order. Support provided. Patient educated on safety on the unit and medications. Routine safety checks every 15 minutes. Patient stated understanding to tell nurse about any new physical symptoms. Patient understands to tell staff of any needs.     R: No adverse drug reactions noted. Patient remains safe at this time and will continue to monitor.    01/10/24 1000  Psych Admission Type (Psych Patients Only)  Admission Status Voluntary  Psychosocial Assessment  Patient Complaints None  Eye Contact Fair  Facial Expression Animated;Anxious  Affect Appropriate to circumstance  Speech Logical/coherent  Interaction Assertive  Motor Activity Other (Comment) (WNL)  Appearance/Hygiene Unremarkable  Behavior Characteristics Cooperative  Mood Anxious;Pleasant  Thought Process  Coherency WDL  Content WDL  Delusions None reported or observed  Perception WDL  Hallucination None reported or observed  Judgment Poor  Confusion None  Danger to Self  Current suicidal ideation? Denies  Agreement Not to Harm Self Yes  Description of Agreement verbal  Danger to Others  Danger to Others None reported or observed

## 2024-01-10 NOTE — Group Note (Signed)
 Date:  01/10/2024 Time:  9:22 AM  Group Topic/Focus:  Goals Group:   The focus of this group is to help patients establish daily goals to achieve during treatment and discuss how the patient can incorporate goal setting into their daily lives to aide in recovery.    Participation Level:  Active  Participation Quality:  Appropriate  Affect:  Appropriate  Cognitive:  Appropriate  Insight: Appropriate  Engagement in Group:  Engaged  Modes of Intervention:  Discussion  Additional Comments:  pt wants to continue doing art work  Nat Rummer 01/10/2024, 9:22 AM

## 2024-01-10 NOTE — Group Note (Signed)
 Recreation Therapy Group Note   Group Topic:Animal Assisted Therapy   Group Date: 01/10/2024 Start Time: 0945 End Time: 1030 Facilitators: Rashawnda Gaba-McCall, LRT,CTRS Location: [address] Dayroom   Animal-Assisted Activity (AAA) Program Checklist/Progress Notes Patient Eligibility Criteria Checklist & Daily Group note for Rec Tx Intervention  AAA/T Program Assumption of Risk Form signed by Patient/ or Parent Legal Guardian Yes  Patient is free of allergies or severe asthma Yes  Patient reports no fear of animals Yes  Patient reports no history of cruelty to animals Yes  Patient understands his/her participation is voluntary Yes  Patient washes hands before animal contact Yes  Patient washes hands after animal contact Yes  Behavioral Response: Engaged   Education: Zanyiah fundraiser, Appropriate Animal Interaction   Education Outcome: Acknowledges education.    Affect/Mood: Appropriate   Participation Level: Engaged   Participation Quality: Independent   Behavior: Appropriate   Speech/Thought Process: Focused   Insight: Good   Judgement: Good   Modes of Intervention: Teaching laboratory technician   Patient Response to Interventions:  Engaged   Education Outcome:  In group clarification offered    Clinical Observations/Individualized Feedback: Patient attended session and interacted appropriately with therapy dog and peers. Patient asked appropriate questions about therapy dog and his training. Patient shared stories about their pets at home with group.     Plan: Continue to engage patient in RT group sessions 2-3x/week.   Temprance Wyre-McCall, LRT,CTRS 01/10/2024 12:34 PM

## 2024-01-10 NOTE — Plan of Care (Signed)

## 2024-01-10 NOTE — [facility] Group Notes (Signed)
 [facility] Group Notes:  (Nursing/MHT/Case Management/Adjunct)  Date:  01/10/2024  Time:  9:40 PM  Type of Therapy:  Wrap-up group  Participation Level:  Active  Participation Quality:  Appropriate  Affect:  Appropriate  Cognitive:  Appropriate  Insight:  Appropriate  Engagement in Group:  Engaged  Modes of Intervention:  Education  Summary of Progress/Problems:Goal to draw, rated day 8/10.  Brianna Vang [place] 01/10/2024, 9:40 PM

## 2024-01-10 NOTE — Group Note (Signed)
 Date:  01/10/2024 Time:  3:56 PM  Group Topic/Focus: Discuss methods to help fall asleep and wake up Self Care:   The focus of this group is to help patients understand the importance of self-care in order to improve or restore emotional, physical, spiritual, interpersonal, and financial health.    Participation Level:  Active  Participation Quality:  Appropriate  Affect:  Appropriate  Cognitive:  Appropriate  Insight: Appropriate  Engagement in Group:  Engaged  Modes of Intervention:  Support  Additional Comments: Patient was appropriately engaged in group.  Kanija Remmel D Harini Dearmond 01/10/2024, 3:56 PM
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
## 2024-01-10 NOTE — BHH Suicide Risk Assessment (Signed)
 [facility] INPATIENT:  Family/Significant Other Suicide Prevention Education  Suicide Prevention Education:  Education Completed; Cassandra Morrow(mother) [phone] ,  (name of family member/significant other) has been identified by the patient as the family member/significant other with whom the patient will be residing, and identified as the person(s) who will aid the patient in the event of a mental health crisis (suicidal ideations/suicide attempt).  With written consent from the patient, the family member/significant other has been provided the following suicide prevention education, prior to the and/or following the discharge of the patient.  Cassandra confirmed patient will not have access to firearms/guns/weapons after discharge. Cassandra reported she'd be willing to assist patient with safely storing and securing medications. Cassandra also reported she'd be willing to assist patient should she have a mental health crisis. Cassandra denied having any safety concerns related to patient discharging home. Cassandra reported patient's father will be picking her up upon discharge.    The suicide prevention education provided includes the following: Suicide risk factors Suicide prevention and interventions National Suicide Hotline telephone number [facility] assessment telephone number [facility] Emergency Assistance 911 [facility] and/or Residential Mobile Crisis Unit telephone number  Request made of family/significant other to: Remove weapons (e.g., guns, rifles, knives), all items previously/currently identified as safety concern.   Remove drugs/medications (over-the-counter, prescriptions, illicit drugs), all items previously/currently identified as a safety concern.  The family member/significant other verbalizes understanding of the suicide prevention education information provided.  The family member/significant other agrees to remove the items of safety concern  listed above.  Masao Junker M Sherly Brodbeck, LCSWA 01/10/2024, 3:20 PM

## 2024-01-10 NOTE — Progress Notes (Signed)
(  Sleep Hours) -5.25  (Any PRNs that were needed, meds refused, or side effects to meds)- N/A  (Any disturbances and when (visitation, over night)- N/A  (Concerns raised by the patient)- pt stated she was worried about her situation with her Ex, Clinical research associate discussed things moving forward and her Tx , pt appeared to receive information well.   (SI/HI/AVH)- denies

## 2024-01-10 NOTE — Group Note (Signed)
 LCSW Group Therapy Note   Group Date: 01/10/2024 Start Time: 1100 End Time: 1200   Participation:  patient was present and actively participated in the discussion  Type of Therapy:  Group Therapy  Topic:  Shining from Within:  Confidence and Self-Love Journey   Objective:  To support participants in developing confidence and self-love through self-awareness, self-compassion, and practical skills that nurture personal growth.   Group Goals Encourage self-reflection and self-acceptance by identifying personal strengths and achievements. Teach skills to challenge negative self-talk and replace it with supportive, truthful self-talk. Foster resilience and self-worth through [employer], gratitude, and self-care practices.   Summary:  This group explores the connection between confidence and self-love by guiding participants through reflection, mindset shifts, and practical tools like affirmations, strength recognition, and goal-setting. Activities are designed to promote self-compassion, build emotional resilience, and normalize the slow, patient journey of inner growth.   Therapeutic Modalities Used Cognitive Behavioral Therapy (CBT): Challenging and reframing unhelpful self-talk. Motivational Interviewing (MI): Encouraging small, achievable goals. Elements of Dialectical Behavioral Therapist (DBT):  Mindfulness and Self-Compassion: Promoting present-moment awareness and kindness toward self.   Liberato Stansbery O Isma Tietje, LCSWA 01/10/2024  5:21 PM

## 2024-01-10 NOTE — Plan of Care (Signed)
   Problem: Education: Goal: Emotional status will improve Outcome: Progressing Goal: Mental status will improve Outcome: Progressing   Problem: Activity: Goal: Interest or engagement in activities will improve Outcome: Progressing   Problem: Safety: Goal: Periods of time without injury will increase Outcome: Progressing

## 2024-01-10 NOTE — Progress Notes (Signed)
 [facility] Inpatient Psychiatry Progress Note  Date: 01/10/24 Patient: Brianna Vang MRN: [id number]  Assessment and Plan: Brianna Vang is a 23 y.o. female admitted for suicidal ideation in the context of of a recent breakup. She endorses a history of multiple low-lethality gestures/attempts and other symptoms consistent with bluster B traits. Of note, the reported suicide attempt/gesture was 8/27.  9/2 - Patient active in the milieu and tolerating escitalopram . Plan to discharge home tomorrow.   # MDD (major depressive disorder) # Cluster B traits - Escitalopram  10 mg daily   Risk Assessment - Low  Discharge Planning Barriers to discharge: Incomplete initial assessment; need to coordinate aftercare plan. Estimated length of stay: tomorrow Predicted Discharge location: Home (w/ sister)    Interval History: Chart reviewed. No significant events overnight. Patient slept a little over 5 hours last night without quetiapine  but reported getting better sleep overall. She would prefer to continue to not take it. She denies any significant side effects with escitalopram . She denied SI today. She reported that she wants to work on socializing while on the unit and to work on re-establishing her routine once she returns home. She reports feeling better compared to admission and would like to receive a referral for therapy when she leaves. She has been active in the milieu, socializing, and has been participating in group activities. No behavioral issues.       Physical Exam MSK/Neuro - Normal gait and station Mental Status Exam Appearance - Casually dressed, NAD Attitude - Calm, pleasant Speech - normal volume, prosody, inflection Mood - okay Affect - Mildly restricted Thought Process - LLGD Thought Content - No delusional TC expressed SI/HI - Denies Perceptions - Denies AVH; not RIS Judgement/Insight - Fair Fund of knowledge - WNL Language - No  impairments      Lab Results:  Admission on 01/08/2024  Component Date Value Ref Range Status   Vitamin B-12 01/08/2024 912  180 - 914 pg/mL Final   Folate 01/08/2024 11.0  >5.9 ng/mL Final   Iron 01/08/2024 54  28 - 170 ug/dL Final   TIBC 91/68/7974 424  250 - 450 ug/dL Final   Saturation Ratios 01/08/2024 13  10.4 - 31.8 % Final   UIBC 01/08/2024 371  ug/dL Final   Ferritin 91/68/7974 8 (L)  11 - 307 ng/mL Final   Retic Ct Pct 01/08/2024 1.2  0.4 - 3.1 % Final   RBC. 01/08/2024 5.08  3.87 - 5.11 MIL/uL Final   Retic Count, Absolute 01/08/2024 63.0  19.0 - 186.0 K/uL Final   Immature Retic Fract 01/08/2024 11.9  2.3 - 15.9 % Final     Vitals: Blood pressure 101/70, pulse 73, temperature 98.4 F (36.9 C), temperature source Oral, resp. rate 14, height 4' 11 (1.499 m), weight 48.3 kg, last menstrual period 01/05/2024, SpO2 100%.    Oliva DELENA Salmon, DO

## 2024-01-11 MED ORDER — ESCITALOPRAM OXALATE 10 MG PO TABS: 10.0000 mg | ORAL_TABLET | Freq: Every day | ORAL | 0 refills | Status: AC

## 2024-01-11 NOTE — BHH Group Notes (Signed)
 Spiritual care group on grief and loss facilitated by Chaplain Rockie Sofia, B[facility]  Group Goal: Support / Education around grief and loss  Members engage in facilitated group support and psycho-social education.  Group Description:  Following introductions and group rules, group members engaged in facilitated group dialogue and support around topic of loss, with particular support around experiences of loss in their lives. Group Identified types of loss (relationships / self / things) and identified patterns, circumstances, and changes that precipitate losses. Reflected on thoughts / feelings around loss, normalized grief responses, and recognized variety in grief experience. Group encouraged individual reflection on safe space and on the coping skills that they are already utilizing.  Group drew on Adlerian / Rogerian and narrative framework  Patient Progress: Brianna Vang attended group and actively engaged and participated in group conversation and activities.

## 2024-01-11 NOTE — Group Note (Signed)
 Date:  01/11/2024 Time:  10:17 AM  Group Topic/Focus:  Goals Group:   The focus of this group is to help patients establish daily goals to achieve during treatment and discuss how the patient can incorporate goal setting into their daily lives to aide in recovery.    Participation Level:  Active  Participation Quality:  Appropriate  Affect:  Appropriate  Cognitive:  Appropriate  Insight: Appropriate  Engagement in Group:  Engaged  Modes of Intervention:  Discussion  Additional Comments:  NA  Daimian Sudberry A Kyngston Pickelsimer 01/11/2024, 10:17 AM

## 2024-01-11 NOTE — BH Assessment (Signed)
(  Sleep Hours) - 6.75 (Any PRNs that were needed, meds refused, or side effects to meds)- None (Any disturbances and when (visitation, over night)- None (Concerns raised by the patient)- None (SI/HI/AVH)- Denies

## 2024-01-11 NOTE — BHH Suicide Risk Assessment (Signed)
 [facility] Discharge Suicide Risk Assessment   Principal Problem: MDD (major depressive disorder) Discharge Diagnoses: Principal Problem:   MDD (major depressive disorder) Active Problems:   Borderline personality disorder (HCC)   Chronic insomnia   Vitamin D  deficiency   Generalized anxiety disorder with panic attacks   Total Time spent with patient: 45 minutes   Mental Status Per Nursing Assessment::   On Admission:  Suicidal ideation indicated by others, Self-harm thoughts  Demographic Factors:  Adolescent or young adult  Loss Factors: Loss of significant relationship  Historical Factors: Impulsivity  Risk Reduction Factors:   Living with sister, responsibility to family  Continued Clinical Symptoms:  MDD, cluster B traits  Cognitive Features That Contribute To Risk:  None    Suicide Risk:  Mild:  Suicidal ideation of limited frequency, intensity, duration, and specificity.  There are no identifiable plans, no associated intent, mild dysphoria and related symptoms, good self-control (both objective and subjective assessment), few other risk factors, and identifiable protective factors, including available and accessible social support.   Follow-up Information     [facility], [facility]. Go on 01/13/2024.   Why: You have a hospital follow up appointment on  01/13/24 at 9:00 am .  The appointment will be held in person.  Following the appointment, you will be scheduled for a cliincal assessment, to obtain necessary therapy and medicaiton management services. Contact information: [address] [place] [address] [phone]                 Plan Of Care/Follow-up recommendations:  See DC summary  Oliva DELENA Salmon, DO 01/11/2024, 8:42 AM

## 2024-01-11 NOTE — Progress Notes (Signed)
 Upon discharging, Nurse went over AVS educating on medications (samples in bag) and follow up appointments. Suicide safety plan was completed. Reviewed with nurse and copy given to patient and placed in chart. Belongings sheet was signed and items returned to patient. Patient denies SI/HI (with no plan) and AVH. Voices no concerns to staff prior discharging off unit. Was safely walked out ride.

## 2024-01-11 NOTE — Group Note (Signed)
 Recreation Therapy Group Note   Group Topic:Problem Solving  Group Date: 01/11/2024 Start Time: 0935 End Time: 1005 Facilitators: Harrel Ferrone-McCall, LRT,CTRS Location: [address] Dayroom   Group Topic: Communication, Team Building, Problem Solving  Goal Area(s) Addresses:  Patient will effectively work with peer towards shared goal.  Patient will identify skills used to make activity successful.  Patient will identify how skills used during activity can be used to reach post d/c goals.   Behavioral Response: Engaged  Intervention: STEM Activity  Activity: Stage manager. In teams of 3-5, patients were given 12 plastic drinking straws and an equal length of masking tape. Using the materials provided, patients were asked to build a landing pad to catch a golf ball dropped from approximately 5 feet in the air. All materials were required to be used by the team in their design. LRT facilitated post-activity discussion.  Education: Pharmacist, community, Scientist, physiological, Discharge Planning   Education Outcome: Acknowledges education/In group clarification offered/Needs additional education.    Affect/Mood: Appropriate   Participation Level: Engaged   Participation Quality: Independent   Behavior: Appropriate   Speech/Thought Process: Focused   Insight: Good   Judgement: Good   Modes of Intervention: STEM Activity   Patient Response to Interventions:  Engaged   Education Outcome:  In group clarification offered    Clinical Observations/Individualized Feedback: Pt was attentive and focused on helping her group create a successful structure. Pt was less hands on but offered suggestions throughout the activity.      Plan: Continue to engage patient in RT group sessions 2-3x/week.   Yeudiel Mateo-McCall, LRT,CTRS 01/11/2024 12:27 PM

## 2024-01-11 NOTE — Plan of Care (Signed)
  Problem: Education: Goal: Knowledge of [facility] Education information/materials will improve Outcome: Adequate for Discharge Goal: Emotional status will improve Outcome: Adequate for Discharge Goal: Mental status will improve Outcome: Adequate for Discharge Goal: Verbalization of understanding the information provided will improve Outcome: Adequate for Discharge   Problem: Education: Goal: Knowledge of [facility] Education information/materials will improve Outcome: Adequate for Discharge Goal: Emotional status will improve Outcome: Adequate for Discharge Goal: Mental status will improve Outcome: Adequate for Discharge Goal: Verbalization of understanding the information provided will improve Outcome: Adequate for Discharge   Problem: Education: Goal: Knowledge of  General Education information/materials will improve Outcome: Adequate for Discharge Goal: Emotional status will improve Outcome: Adequate for Discharge Goal: Mental status will improve Outcome: Adequate for Discharge Goal: Verbalization of understanding the information provided will improve Outcome: Adequate for Discharge   Problem: Activity: Goal: Interest or engagement in activities will improve Outcome: Adequate for Discharge Goal: Sleeping patterns will improve Outcome: Adequate for Discharge   Problem: Health Behavior/Discharge Planning: Goal: Identification of resources available to assist in meeting health care needs will improve Outcome: Adequate for Discharge Goal: Compliance with treatment plan for underlying cause of condition will improve Outcome: Adequate for Discharge

## 2024-01-11 NOTE — Plan of Care (Signed)
   Problem: Education: Goal: Knowledge of [place] [facility] Education information/materials will improve Outcome: Progressing Goal: Emotional status will improve Outcome: Progressing Goal: Mental status will improve Outcome: Progressing Goal: Verbalization of understanding the information provided will improve Outcome: Progressing

## 2024-01-11 NOTE — Progress Notes (Addendum)
  [facility] Adult Case Management Discharge Plan :  Will you be returning to the same living situation after discharge:  Yes,  pt returning home after discharge. At discharge, do you have transportation home?: Yes,  pt reported family will be providing transportation at 3:30PM. Do you have the ability to pay for your medications: Yes,  pt reported having assistance of family.  Release of information consent forms completed and in the chart;  Patient's signature needed at discharge.  Patient to Follow up at:  Follow-up Information     [facility], [facility]. Go on 01/13/2024.   Why: You have a hospital follow up appointment on  01/13/24 at 9:00 am .  The appointment will be held in person.  Following the appointment, you will be scheduled for a cliincal assessment, to obtain necessary therapy and medicaiton management services. Contact information: [address] [place] [address] [phone]                 Next level of care provider has access to [facility] Link:no  Safety Planning and Suicide Prevention discussed: Yes,  completed with Cassandra Morrow(mother) [phone]      Has patient been referred to the Quitline?: Patient does not use tobacco/nicotine products  Patient has been referred for addiction treatment: No known substance use disorder.  Ardell Makarewicz M Bettyjean Stefanski, LCSWA 01/11/2024, 9:03 AM

## 2024-01-11 NOTE — Progress Notes (Signed)
   01/10/24 2040  Psych Admission Type (Psych Patients Only)  Admission Status Voluntary  Psychosocial Assessment  Patient Complaints None  Eye Contact Fair  Facial Expression Flat  Affect Appropriate to circumstance  Speech Soft  Interaction Childlike  Motor Activity  (WNL)  Appearance/Hygiene In scrubs;Disheveled  Behavior Characteristics Appropriate to situation  Mood Pleasant  Thought Process  Coherency WDL  Content WDL  Delusions None reported or observed  Perception WDL  Hallucination None reported or observed  Judgment Poor  Confusion None  Danger to Self  Current suicidal ideation? Passive (Currenyly Denies)  Agreement Not to Harm Self Yes  Description of Agreement Notify Staff  Danger to Others  Danger to Others None reported or observed

## 2024-01-28 ENCOUNTER — Other Ambulatory Visit: Payer: Self-pay

## 2024-01-28 ENCOUNTER — Emergency Department
Admission: EM | Admit: 2024-01-28 | Discharge: 2024-01-28 | Disposition: A | Discharge status: Home / Self Care | Attending: Emergency Medicine | Admitting: Emergency Medicine

## 2024-01-28 ENCOUNTER — Emergency Department

## 2024-01-28 DIAGNOSIS — M25521 Pain in right elbow: Secondary | ICD-10-CM | POA: Diagnosis present

## 2024-01-28 DIAGNOSIS — M25522 Pain in left elbow: Secondary | ICD-10-CM

## 2024-01-28 DIAGNOSIS — X509XXA Other and unspecified overexertion or strenuous movements or postures, initial encounter: Secondary | ICD-10-CM | POA: Diagnosis not present

## 2024-01-28 NOTE — Discharge Instructions (Addendum)
 Your x-ray was normal.  Please follow-up with your outpatient provider.  Please return for any new, worsening, or changing symptoms or other concerns.  It was a pleasure caring for you today.

## 2024-01-28 NOTE — ED Provider Notes (Signed)
 [facility] Provider Note    Event Date/Time   First MD Initiated Contact with Patient 01/28/24 1129     (approximate)   History   Elbow Pain   HPI  Brianna Vang is a 23 y.o. female with a past medical history of depression, borderline personality disorder, anxiety who presents today for evaluation of right elbow pain.  Patient reports that she was reaching her arm through a tight area yesterday and thinks that she damaged her elbow in the process.  She reports that she has pain when moving her elbow.  No numbness or tingling.  No swelling noted.  She is still able to move her elbow fully and normally.  Patient Active Problem List   Diagnosis Date Noted   MDD (major depressive disorder) 01/08/2024   Borderline personality disorder (HCC) 01/08/2024   Chronic insomnia 01/08/2024   Vitamin D  deficiency 01/08/2024   Generalized anxiety disorder with panic attacks 01/08/2024   MDD (major depressive disorder), single episode, severe (HCC) 01/07/2024          Physical Exam   Triage Vital Signs: ED Triage Vitals  Encounter Vitals Group     BP 01/28/24 1116 125/79     Girls Systolic BP Percentile --      Girls Diastolic BP Percentile --      Boys Systolic BP Percentile --      Boys Diastolic BP Percentile --      Pulse Rate 01/28/24 1116 71     Resp 01/28/24 1116 16     Temp 01/28/24 1116 98.2 F (36.8 C)     Temp Source 01/28/24 1116 Oral     SpO2 01/28/24 1116 100 %     Weight 01/28/24 1115 120 lb (54.4 kg)     Height 01/28/24 1115 4' 11 (1.499 m)     Head Circumference --      Peak Flow --      Pain Score 01/28/24 1117 5     Pain Loc --      Pain Education --      Exclude from Growth Chart --     Most recent vital signs: Vitals:   01/28/24 1116  BP: 125/79  Pulse: 71  Resp: 16  Temp: 98.2 F (36.8 C)  SpO2: 100%    Physical Exam Vitals and nursing note reviewed.  Constitutional:      General: Awake and alert. No acute  distress.    Appearance: Normal appearance. The patient is normal weight.  HENT:     Head: Normocephalic and atraumatic.     Mouth: Mucous membranes are moist.  Eyes:     General: PERRL. Normal EOMs        Right eye: No discharge.        Left eye: No discharge.     Conjunctiva/sclera: Conjunctivae normal.  Cardiovascular:     Rate and Rhythm: Normal rate and regular rhythm.     Pulses: Normal pulses.  Pulmonary:     Effort: Pulmonary effort is normal. No respiratory distress.     Breath sounds: Normal breath sounds.  Abdominal:     Abdomen is soft. There is no abdominal tenderness. No rebound or guarding. No distention. Musculoskeletal:        General: No swelling. Normal range of motion.     Cervical back: Normal range of motion and neck supple.  Right elbow: Normal range of motion of her elbow with flexion and extension, able to supinate  and pronate against resistance.  Able to extend and flex fully.  Mild tenderness palpation to lateral epicondyle.  No wounds noted.  Normal grip strength.  Compartment soft and compressible throughout. Skin:    General: Skin is warm and dry.     Capillary Refill: Capillary refill takes less than 2 seconds.     Findings: No rash.  Neurological:     Mental Status: The patient is awake and alert.      ED Results / Procedures / Treatments   Labs (all labs ordered are listed, but only abnormal results are displayed) Labs Reviewed - No data to display   EKG     RADIOLOGY I independently reviewed and interpreted imaging and agree with radiologists findings.     PROCEDURES:  Critical Care performed:   Procedures   MEDICATIONS ORDERED IN ED: Medications - No data to display   IMPRESSION / MDM / ASSESSMENT AND PLAN / ED COURSE  I reviewed the triage vital signs and the nursing notes.   Differential diagnosis includes, but is not limited to, contusion, subluxation, tendinitis, less likely fracture or dislocation.  Patient is  awake and alert, hemodynamically stable and afebrile.  She has normal radial pulse, normal grip strength.  She has full and normal range of motion of her elbow, full flexion and extension, normal pronation and supination against resistance.  Normal intrinsic muscle function of her hand.  Compartment soft and compressible throughout.  X-ray obtained in triage negative for any acute osseous injury.  Patient is reassured by these results.  She was given an Ace wrap for extra support. Patient was offered analgesia but declined.  We discussed outpatient follow-up and return precautions.  Patient stands and agrees with plan.  She was discharged in stable condition.   Patient's presentation is most consistent with acute complicated illness / injury requiring diagnostic workup.   FINAL CLINICAL IMPRESSION(S) / ED DIAGNOSES   Final diagnoses:  Left elbow pain     Rx / DC Orders   ED Discharge Orders     None        Note:  This document was prepared using Dragon voice recognition software and may include unintentional dictation errors.   Jennyfer Nickolson E, PA-C 01/28/24 1239    Arlander Charleston, MD 01/28/24 [phone]

## 2024-01-28 NOTE — ED Triage Notes (Signed)
 To ED for R elbow popping and pain since yesterday AM. No known injury but was squeezing arm through a door recently.

## 2024-02-16 ENCOUNTER — Ambulatory Visit

## 2024-03-19 ENCOUNTER — Ambulatory Visit

## 2024-03-22 ENCOUNTER — Ambulatory Visit

## 2024-03-22 VITALS — BP 117/60 | HR 76 | Ht 59.0 in | Wt 120.2 lb

## 2024-03-22 DIAGNOSIS — F339 Major depressive disorder, recurrent, unspecified: Secondary | ICD-10-CM | POA: Diagnosis not present

## 2024-03-22 DIAGNOSIS — E441 Mild protein-calorie malnutrition: Secondary | ICD-10-CM | POA: Diagnosis not present

## 2024-03-22 DIAGNOSIS — E559 Vitamin D deficiency, unspecified: Secondary | ICD-10-CM

## 2024-03-22 DIAGNOSIS — E46 Unspecified protein-calorie malnutrition: Secondary | ICD-10-CM | POA: Insufficient documentation

## 2024-03-22 NOTE — Progress Notes (Signed)
 New Patient Visit   Physician: Catera Hankins A Dailyn Kempner, MD  Patient: Brianna Vang   DOB: Sep 29, 2000   23 y.o. Female  MRN: [id number] Visit Date: 03/22/2024   Chief Complaint  Patient presents with   Establish Care   Subjective  Brianna Vang is a 23 y.o. female who presents today as a new patient to establish care.   HPI  Discussed the use of AI scribe software for clinical note transcription with the patient, who gave verbal consent to proceed.  History of Present Illness   Brianna Vang is a 23 year old female who presents for follow-up on her mental health management.  Depressive and anxiety symptoms - Currently taking Lexapro  since early September for depression and anxiety - Experiences occasional bad days, but mood is much improved over the last several months - Sleep is adequate - No current school attendance; works as an Licensed conveyancer with physical activity  Nutritional status and weight changes - Experienced weight loss since starting work at [employer] in September 2023 - Trying to maintain weight by eating a balanced diet and occasionally drinking PediaSure - Not typically a breakfast eater but has recently started eating in the mornings, cooks for herself - Balanced diet includes fruits, vegetables, and protein - 12/2023 labs indicating iron, vitamin D  deficiency - Concerned about iron levels due to persistent fatigue and craving ice (pica)  - No SOB with exertion, CP or dizziness    Menstrual cycle regular, not on BC, not sexually active No history of ETOH, smoking, drug use.   ASSESSMENT & PLAN  Encounter Diagnoses  Name Primary?   Mild protein-calorie malnutrition Yes   Vitamin D  deficiency    Episode of recurrent major depressive disorder, unspecified depression episode severity     Orders Placed This Encounter  Procedures   CBC with Differential/Platelet   Comprehensive metabolic panel with GFR   Lipid panel   Urinalysis, Routine w reflex  microscopic   Iron, TIBC and Ferritin Panel   B12 and Folate Panel   VITAMIN D  25 Hydroxy (Vit-D Deficiency, Fractures)    Assessment and Plan    Depression and Anxiety Mood stable with Lexapro . Considering counseling through [facility]. - Continue Lexapro  as prescribed. - Encouraged participation in counseling sessions  Mild Protein-Calorie Malnutrition Weight loss since starting work. Balanced diet with PediaSure. - Continue balanced diet with PediaSure supplementation. - Monitor weight to ensure stability. - Will consider iron supplementation based on lab results. - Ordered lab tests to recheck vitamin D  levels. - Recommended vitamin D3 supplementation at 1000 IU daily.  General Health Maintenance Discussed exercise for osteoporosis prevention and regular health screenings. - Encouraged regular exercise, including weight-bearing activities. - Recommended annual blood sugar checks due to family history of diabetes. - Discussed flu vaccination; she will decide and inform nurse if interested.    F/u in 2 weeks to review labs    Objective  BP 117/60   Pulse 76   Ht 4' 11 (1.499 m)   Wt 120 lb 3.2 oz (54.5 kg)   LMP 03/22/2024 (Exact Date)   SpO2 97%   BMI 24.28 kg/m      Review of Systems  Constitutional:  Negative for chills, fever and weight loss.  Eyes:  Negative for blurred vision. h Respiratory:  Negative for cough and shortness of breath.   Cardiovascular:  Negative for chest pain and palpitations.  Skin:  Negative for rash.  Psychiatric/Behavioral:  Negative for depression. The patient is  not nervous/anxious.      Physical Exam Physical Exam Vitals reviewed.  Constitutional:      Appearance: Normal appearance. Well-developed with normal weight.  HENT:     Head: Normocephalic and atraumatic.  Normal mucous membranes, no oral lesions Eyes:     Pupils: Pupils are equal, round, and reactive to light.  Neck:     Thyroid : No thyroid  mass or thyromegaly.   Cardiovascular:     Rate and Rhythm: Normal rate and regular rhythm. Normal heart sounds. Normal peripheral pulses Pulmonary:     Normal breath sounds with normal effort Abdominal:   Abdomen is soft, without tenderness or noted hepatosplenomegaly Musculoskeletal:        General: No swelling or edema  Lymphadenopathy:     Cervical: No cervical adenopathy.  Skin:    General: Skin is warm and dry without noticeable rash. Neurological:     General: No focal deficit present.  Psychiatric:        Mood and Affect: Mood, behavior and cognition normal   Past Medical History:  Diagnosis Date   Asthma    Bipolar disorder, unspecified (HCC) 01/08/2024   Borderline personality disorder (HCC) 01/08/2024   Chronic insomnia 01/08/2024   History of suicidal behavior 01/08/2024   Vitamin D  deficiency 01/08/2024   No past surgical history on file. No family status information on file.   No family history on file. Social History   Socioeconomic History   Marital status: Single    Spouse name: Not on file   Number of children: Not on file   Years of education: Not on file   Highest education level: Not on file  Occupational History   Not on file  Tobacco Use   Smoking status: Never   Smokeless tobacco: Never  Vaping Use   Vaping status: Never Used  Substance and Sexual Activity   Alcohol use: Never   Drug use: Not Currently   Sexual activity: Not Currently    Birth control/protection: None  Other Topics Concern   Not on file  Social History Narrative   Not on file   Social Drivers of Health   Financial Resource Strain: Not on file  Food Insecurity: Food Insecurity Present (01/08/2024)   Hunger Vital Sign    Worried About Running Out of Food in the Last Year: Sometimes true    Ran Out of Food in the Last Year: Never true  Transportation Needs: No Transportation Needs (01/08/2024)   PRAPARE - Administrator, Civil Service (Medical): No    Lack of Transportation  (Non-Medical): No  Physical Activity: Not on file  Stress: Not on file  Social Connections: Not on file   Outpatient Medications Prior to Visit  Medication Sig   escitalopram  (LEXAPRO ) 10 MG tablet Take 1 tablet (10 mg total) by mouth daily.   No facility-administered medications prior to visit.   Allergies  Allergen Reactions   Field Mint [Mentha] Itching and Swelling    Immunization History  Administered Date(s) Administered   Meningococcal Mcv4o 04/16/2020    Health Maintenance  Topic Date Due   HPV VACCINES (1 - 3-dose series) Never done   HIV Screening  Never done   Meningococcal B Vaccine (1 of 2 - Standard) Never done   Hepatitis C Screening  Never done   DTaP/Tdap/Td (1 - Tdap) Never done   Pneumococcal Vaccine (1 of 2 - PCV) Never done   Hepatitis B Vaccines 19-59 Average Risk (1 of 3 -  19+ 3-dose series) Never done   Cervical Cancer Screening (Pap smear)  Never done   Influenza Vaccine  12/09/2023   COVID-19 Vaccine (1 - 2025-26 season) Never done    Patient Care Team: Kasara Schomer A, MD as PCP - General (Family Medicine)  Depression Screen     No data to display           Parris DELENA Juneau, MD  [facility] [phone] (phone) [phone] (fax)  [facility]

## 2024-03-23 ENCOUNTER — Ambulatory Visit: Payer: Self-pay

## 2024-03-23 ENCOUNTER — Other Ambulatory Visit: Payer: Self-pay

## 2024-03-23 LAB — LIPID PANEL
Cholesterol: 154 mg/dL (ref <200)
HDL: 73 mg/dL (ref >=50)
LDL Cholesterol (Calc): 70 mg/dL
Non-HDL Cholesterol (Calc): 81 mg/dL (ref <130)
Total CHOL/HDL Ratio: 2.1 (calc) (ref <5.0)
Triglycerides: 37 mg/dL (ref <150)

## 2024-03-23 LAB — CBC WITH DIFFERENTIAL/PLATELET
Absolute Lymphocytes: 1894 {cells}/uL (ref 850–3900)
Absolute Monocytes: 870 {cells}/uL (ref 200–950)
Basophils Absolute: 39 {cells}/uL (ref 0–200)
Basophils Relative: 0.5 %
Eosinophils Absolute: 493 {cells}/uL (ref 15–500)
Eosinophils Relative: 6.4 %
HCT: 34.5 % — ABNORMAL LOW (ref 35.0–45.0)
Hemoglobin: 10.4 g/dL — ABNORMAL LOW (ref 11.7–15.5)
MCH: 22.8 pg — ABNORMAL LOW (ref 27.0–33.0)
MCHC: 30.1 g/dL — ABNORMAL LOW (ref 32.0–36.0)
MCV: 75.7 fL — ABNORMAL LOW (ref 80.0–100.0)
MPV: 12 fL (ref 7.5–12.5)
Monocytes Relative: 11.3 %
Neutro Abs: 4404 {cells}/uL (ref 1500–7800)
Neutrophils Relative %: 57.2 %
Platelets: 391 Thousand/uL (ref 140–400)
RBC: 4.56 Million/uL (ref 3.80–5.10)
RDW: 14.4 % (ref 11.0–15.0)
Total Lymphocyte: 24.6 %
WBC: 7.7 Thousand/uL (ref 3.8–10.8)

## 2024-03-23 LAB — COMPREHENSIVE METABOLIC PANEL WITH GFR
AG Ratio: 1.5 (calc) (ref 1.0–2.5)
ALT: 6 U/L (ref 6–29)
AST: 14 U/L (ref 10–30)
Albumin: 4.3 g/dL (ref 3.6–5.1)
Alkaline phosphatase (APISO): 46 U/L (ref 31–125)
BUN: 13 mg/dL (ref 7–25)
CO2: 24 mmol/L (ref 20–32)
Calcium: 9.4 mg/dL (ref 8.6–10.2)
Chloride: 106 mmol/L (ref 98–110)
Creat: 0.64 mg/dL (ref 0.50–0.96)
Globulin: 2.8 g/dL (ref 1.9–3.7)
Glucose, Bld: 85 mg/dL (ref 65–139)
Potassium: 4 mmol/L (ref 3.5–5.3)
Sodium: 138 mmol/L (ref 135–146)
Total Bilirubin: 1.7 mg/dL — ABNORMAL HIGH (ref 0.2–1.2)
Total Protein: 7.1 g/dL (ref 6.1–8.1)
eGFR: 129 mL/min/1.73m2 (ref >=60)

## 2024-03-23 LAB — B12 AND FOLATE PANEL
Folate: 15.2 ng/mL
Vitamin B-12: 413 pg/mL (ref 200–1100)

## 2024-03-23 LAB — URINALYSIS, ROUTINE W REFLEX MICROSCOPIC
Bilirubin Urine: NEGATIVE
Glucose, UA: NEGATIVE
Hgb urine dipstick: NEGATIVE
Leukocytes,Ua: NEGATIVE
Nitrite: NEGATIVE
Protein, ur: NEGATIVE
Specific Gravity, Urine: 1.027 (ref 1.001–1.035)
pH: 6.5 (ref 5.0–8.0)

## 2024-03-23 LAB — IRON,TIBC AND FERRITIN PANEL
%SAT: 14 % — ABNORMAL LOW (ref 16–45)
Ferritin: 6 ng/mL — ABNORMAL LOW (ref 16–154)
Iron: 61 ug/dL (ref 40–190)
TIBC: 423 ug/dL (ref 250–450)

## 2024-03-23 LAB — VITAMIN D 25 HYDROXY (VIT D DEFICIENCY, FRACTURES): Vit D, 25-Hydroxy: 22 ng/mL — ABNORMAL LOW (ref 30–100)

## 2024-03-23 MED ORDER — IRON (FERROUS SULFATE) 325 (65 FE) MG PO TABS: 325.0000 mg | ORAL_TABLET | Freq: Every day | ORAL | 1 refills | Status: AC

## 2024-04-27 ENCOUNTER — Ambulatory Visit

## 2024-04-27 VITALS — BP 106/70 | HR 87 | Ht 59.0 in | Wt 122.2 lb

## 2024-04-27 DIAGNOSIS — E559 Vitamin D deficiency, unspecified: Secondary | ICD-10-CM

## 2024-04-27 DIAGNOSIS — E46 Unspecified protein-calorie malnutrition: Secondary | ICD-10-CM

## 2024-04-27 DIAGNOSIS — E611 Iron deficiency: Secondary | ICD-10-CM | POA: Insufficient documentation

## 2024-04-27 NOTE — Progress Notes (Signed)
 "           Progress Note  Physician: Parris DELENA Juneau, MD   HPI: Brianna Vang is a 23 y.o. female presenting on 04/27/2024 for Follow-up .  Discussed the use of AI scribe software for clinical note transcription with the patient, who gave verbal consent to proceed.  History of Present Illness      Discussed the use of AI scribe software for clinical note transcription with the patient, who gave verbal consent to proceed.  History of Present Illness   Brianna Vang is a 23 year old female who presents for follow-up to review lab work.  Anemia and iron  deficiency - Hemoglobin 10.4 g/dL indicating anemia - Ferritin 6%, iron  saturation 14%, total iron  61 - Currently taking iron  325 mg daily, but adherence is inconsistent, often taking it every other day - No significant stomach pain or nausea except occasionally during menstrual cycle or after eating spicy food  Hyperbilirubinemia - Mildly elevated bilirubin at 1.7  Vitamin d  deficiency - Vitamin D  level 22, indicating deficiency  Dietary habits and nutrition - Diet includes a variety of fruits and vegetables - Enjoys Mediterranean and Korean cuisine - No significant weight changes      Medical history:  Relevant past medical, surgical, family and social history reviewed and updated as indicated. Interim medical history since our last visit reviewed.  Allergies and medications reviewed and updated.   ROS: Negative unless specifically indicated above in HPI.   Current Medications[1]       Objective:     There were no vitals taken for this visit.  Wt Readings from Last 3 Encounters:  03/22/24 120 lb 3.2 oz (54.5 kg)  01/28/24 120 lb (54.4 kg)  04/07/23 120 lb (54.4 kg)    Physical Exam  Physical Exam Vitals reviewed.  Constitutional:      Appearance: Normal appearance. Well-developed with normal weight.  Cardiovascular:     Rate and Rhythm: Normal rate and regular rhythm. Normal heart sounds.  Normal peripheral pulses Pulmonary:     Normal breath sounds with normal effort Skin:    General: Skin is warm and dry without noticeable rash. Neurological:     General: No focal deficit present.  Psychiatric:        Mood and Affect: Mood, behavior and cognition normal      Assessment & Plan:   Encounter Diagnoses  Name Primary?   Vitamin D  deficiency Yes   Malnutrition, unspecified type     No orders of the defined types were placed in this encounter.    Assessment and Plan        #1 Vitamin D  deficiency, iron  deficiency.  - Patient diet seems to be varied enough and BMI is in normal range.  Question whether we have an underlying cause of malabsorption which she did have some blood in stool few years ago which was not hemorrhoidal from her description of symptoms.  She does not have any abdominal pain currently but did have some right upper quadrant pain intermittently some weeks ago.  DDx of underlying cause of celiac/chrons or other condition which could be causing malabsorption  Will have the patient continue with iron  supplementation and vitamin D  supplementation.  Her iron  is not increased despite supplementation which indicates possible secondary cause.  Plan to repeat labs end of February  2.  Bilirubin elevation.  Will repeat CMP at the end of February if bilirubin remains elevated will check fractionated bilirubin, further labs,  and right upper quadrant ultrasound.               [1]  Current Outpatient Medications:    escitalopram  (LEXAPRO ) 10 MG tablet, Take 1 tablet (10 mg total) by mouth daily., Disp: 30 tablet, Rfl: 0   Iron , Ferrous Sulfate , 325 (65 Fe) MG TABS, Take 325 mg by mouth daily., Disp: 90 tablet, Rfl: 1  "

## 2024-07-06 ENCOUNTER — Ambulatory Visit
